# Patient Record
Sex: Female | Born: 1960 | Hispanic: No | Marital: Married | Smoking: Never smoker
Health system: Southern US, Community
[De-identification: ages and names within clinical notes are randomized; demographics above are authoritative.]

## PROBLEM LIST (undated history)

## (undated) DIAGNOSIS — R112 Nausea with vomiting, unspecified: Secondary | ICD-10-CM

## (undated) HISTORY — DX: Nausea with vomiting, unspecified: R11.2

## (undated) HISTORY — PX: BREAST CYST ASPIRATION: SHX578

---
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
## 1990-01-19 HISTORY — PX: TUBAL LIGATION: SHX77

## 1999-01-20 HISTORY — PX: NASAL SEPTUM SURGERY: SHX37

## 2013-06-20 DIAGNOSIS — M1711 Unilateral primary osteoarthritis, right knee: Secondary | ICD-10-CM | POA: Insufficient documentation

## 2018-08-30 DIAGNOSIS — N819 Female genital prolapse, unspecified: Secondary | ICD-10-CM | POA: Insufficient documentation

## 2019-01-20 HISTORY — PX: COLONOSCOPY: SHX174

## 2021-04-14 ENCOUNTER — Other Ambulatory Visit: Payer: Self-pay | Admitting: Family Medicine

## 2021-04-14 DIAGNOSIS — Z1231 Encounter for screening mammogram for malignant neoplasm of breast: Secondary | ICD-10-CM

## 2021-04-22 ENCOUNTER — Ambulatory Visit
Admission: RE | Admit: 2021-04-22 | Discharge: 2021-04-22 | Disposition: A | Discharge status: Home / Self Care | Payer: PRIVATE HEALTH INSURANCE | Source: Ambulatory Visit | Attending: Family Medicine | Admitting: Family Medicine

## 2021-04-22 DIAGNOSIS — Z1231 Encounter for screening mammogram for malignant neoplasm of breast: Secondary | ICD-10-CM

## 2021-05-22 ENCOUNTER — Ambulatory Visit
Admission: EM | Admit: 2021-05-22 | Discharge: 2021-05-22 | Disposition: A | Discharge status: Home / Self Care | Payer: PRIVATE HEALTH INSURANCE | Attending: Urgent Care | Admitting: Urgent Care

## 2021-05-22 DIAGNOSIS — H109 Unspecified conjunctivitis: Secondary | ICD-10-CM | POA: Diagnosis not present

## 2021-05-22 MED ORDER — TOBRAMYCIN 0.3 % OP SOLN: 1.0000 [drp] | OPHTHALMIC | 0 refills | Status: DC

## 2021-05-22 NOTE — ED Provider Notes (Signed)
?  [facility] ? ? ?MRN: [id number] DOB: 1960/11/12 ? ?Subjective:  ? ?Kristin Bridges is a 61 y.o. female presenting for 1 day history of irritated, stinging right thigh with associated redness and clear drainage.  Had matting of her eyelashes.  Patient works as a Lawyer.  No tunnel vision, visual halos, photophobia, eyelid pain or swelling, eye trauma.  No contact lens use.  Patient regularly gets her eye pressures checked with her general eye exams.  No history of glaucoma. ? ?No current facility-administered medications for this encounter. ?No current outpatient medications on file.  ? ?Not on File ? ?History reviewed. No pertinent past medical history.  ? ?Past Surgical History:  ?Procedure Laterality Date  ? BREAST CYST ASPIRATION    ? ? ?Family History  ?Problem Relation Age of Onset  ? Breast cancer Maternal Grandmother   ? ? ?Social History  ? ?Tobacco Use  ? Smoking status: Never  ? Smokeless tobacco: Never  ? ? ?ROS ? ? ?Objective:  ? ?Vitals: ?BP (!) 150/85 (BP Location: Left Arm)   Pulse 66   Temp 98 ?F (36.7 ?C) (Oral)   Resp 18   SpO2 98%  ? ?Physical Exam ?Constitutional:   ?   General: She is not in acute distress. ?   Appearance: Normal appearance. She is well-developed. She is not ill-appearing, toxic-appearing or diaphoretic.  ?HENT:  ?   Head: Normocephalic and atraumatic.  ?   Nose: Nose normal.  ?   Mouth/Throat:  ?   Mouth: Mucous membranes are moist.  ?Eyes:  ?   General: Lids are everted, no foreign bodies appreciated. Vision grossly intact. No scleral icterus.    ?   Right eye: Discharge (clear) present. No foreign body or hordeolum.     ?   Left eye: No foreign body, discharge or hordeolum.  ?   Extraocular Movements: Extraocular movements intact.  ?   Conjunctiva/sclera:  ?   Right eye: Right conjunctiva is injected. No chemosis, exudate or hemorrhage. ?   Left eye: Left conjunctiva is not injected. No chemosis, exudate or hemorrhage. ?Cardiovascular:  ?   Rate  and Rhythm: Normal rate.  ?Pulmonary:  ?   Effort: Pulmonary effort is normal.  ?Skin: ?   General: Skin is warm and dry.  ?Neurological:  ?   General: No focal deficit present.  ?   Mental Status: She is alert and oriented to person, place, and time.  ?Psychiatric:     ?   Mood and Affect: Mood normal.     ?   Behavior: Behavior normal.  ? ? ?Assessment and Plan :  ? ?PDMP not reviewed this encounter. ? ?1. Bacterial conjunctivitis of right eye   ? ?Will cover for bacterial conjunctivitis of the right eye with tobramycin eyedrops.  Low suspicion for an acute ophthalmologic emergency. Counseled patient on potential for adverse effects with medications prescribed/recommended today, ER and return-to-clinic precautions discussed, patient verbalized understanding. ? ?  ?Wallis Bamberg, PA-C ?05/22/21 1017 ? ?

## 2021-05-22 NOTE — ED Triage Notes (Signed)
Pt c/o right conjunctivitis onset this morning. States when she woke up her eye was matted with drainage.  ?

## 2022-03-05 ENCOUNTER — Ambulatory Visit (INDEPENDENT_AMBULATORY_CARE_PROVIDER_SITE_OTHER): Payer: PRIVATE HEALTH INSURANCE | Admitting: Family Medicine

## 2022-03-05 ENCOUNTER — Encounter: Payer: Self-pay | Admitting: Family Medicine

## 2022-03-05 VITALS — BP 128/85 | HR 73 | Temp 98.0°F | Resp 16 | Ht 68.0 in | Wt 203.0 lb

## 2022-03-05 DIAGNOSIS — Z114 Encounter for screening for human immunodeficiency virus [HIV]: Secondary | ICD-10-CM

## 2022-03-05 DIAGNOSIS — Z0000 Encounter for general adult medical examination without abnormal findings: Secondary | ICD-10-CM

## 2022-03-05 DIAGNOSIS — Z130 Encounter for screening for diseases of the blood and blood-forming organs and certain disorders involving the immune mechanism: Secondary | ICD-10-CM | POA: Diagnosis not present

## 2022-03-05 DIAGNOSIS — Z1231 Encounter for screening mammogram for malignant neoplasm of breast: Secondary | ICD-10-CM

## 2022-03-05 DIAGNOSIS — Z1329 Encounter for screening for other suspected endocrine disorder: Secondary | ICD-10-CM | POA: Diagnosis not present

## 2022-03-05 DIAGNOSIS — Z13220 Encounter for screening for lipoid disorders: Secondary | ICD-10-CM | POA: Diagnosis not present

## 2022-03-05 DIAGNOSIS — Z13228 Encounter for screening for other metabolic disorders: Secondary | ICD-10-CM | POA: Diagnosis not present

## 2022-03-05 DIAGNOSIS — Z1159 Encounter for screening for other viral diseases: Secondary | ICD-10-CM

## 2022-03-05 DIAGNOSIS — Z1211 Encounter for screening for malignant neoplasm of colon: Secondary | ICD-10-CM

## 2022-03-05 NOTE — Progress Notes (Signed)
New Patient Office Visit  Subjective    Patient ID: Kristin Bridges, female    DOB: October 07, 1960  Age: 62 y.o. MRN: [id number]  CC:  Chief Complaint  Patient presents with   Establish Care    HPI Kristin Bridges presents to establish care and for routine annual exam. Patient denies acute complaints.    Outpatient Encounter Medications as of 03/05/2022  Medication Sig   Cholecalciferol 25 MCG (1000 UT) capsule    ZINC METHIONATE PO    calcium carbonate (OSCAL) 1500 (600 Ca) MG TABS tablet    Multiple Vitamin (MULTIVITAMIN) capsule Take by mouth.   tobramycin (TOBREX) 0.3 % ophthalmic solution Place 1 drop into the right eye every 4 (four) hours.   No facility-administered encounter medications on file as of 03/05/2022.    History reviewed. No pertinent past medical history.  Past Surgical History:  Procedure Laterality Date   BREAST CYST ASPIRATION      Family History  Problem Relation Age of Onset   Breast cancer Maternal Grandmother     Social History   Socioeconomic History   Marital status: Married    Spouse name: Not on file   Number of children: Not on file   Years of education: Not on file   Highest education level: Not on file  Occupational History   Not on file  Tobacco Use   Smoking status: Never   Smokeless tobacco: Never  Substance and Sexual Activity   Alcohol use: Not Currently   Drug use: Not Currently   Sexual activity: Yes  Other Topics Concern   Not on file  Social History Narrative   Not on file   Social Determinants of Health   Financial Resource Strain: Not on file  Food Insecurity: Not on file  Transportation Needs: Not on file  Physical Activity: Not on file  Stress: Not on file  Social Connections: Not on file  Intimate Partner Violence: Not on file    Review of Systems  All other systems reviewed and are negative.       Objective    BP 128/85   Pulse 73   Temp 98 F (36.7 C) (Oral)   Resp 16   Ht 5' 8"$  (1.727 m)    Wt 203 lb (92.1 kg)   SpO2 96%   BMI 30.87 kg/m   Physical Exam Vitals and nursing note reviewed.  Constitutional:      General: She is not in acute distress. HENT:     Head: Normocephalic and atraumatic.     Right Ear: Tympanic membrane, ear canal and external ear normal.     Left Ear: Tympanic membrane, ear canal and external ear normal.     Nose: Nose normal.     Mouth/Throat:     Mouth: Mucous membranes are moist.     Pharynx: Oropharynx is clear.  Eyes:     Conjunctiva/sclera: Conjunctivae normal.     Pupils: Pupils are equal, round, and reactive to light.  Neck:     Thyroid: No thyromegaly.  Cardiovascular:     Rate and Rhythm: Normal rate and regular rhythm.     Heart sounds: Normal heart sounds. No murmur heard. Pulmonary:     Effort: Pulmonary effort is normal. No respiratory distress.     Breath sounds: Normal breath sounds.  Abdominal:     General: There is no distension.     Palpations: Abdomen is soft. There is no mass.     Tenderness:  There is no abdominal tenderness.  Musculoskeletal:        General: Normal range of motion.     Cervical back: Normal range of motion and neck supple.  Skin:    General: Skin is warm and dry.  Neurological:     General: No focal deficit present.     Mental Status: She is alert and oriented to person, place, and time.  Psychiatric:        Mood and Affect: Mood normal.        Behavior: Behavior normal.         Assessment & Plan:   1. Annual physical exam  - CMP14+EGFR  2. Screening for deficiency anemia  - CBC with Differential  3. Screening for lipid disorders  - Lipid Panel  4. Screening for endocrine/metabolic/immunity disorders   5. Encounter for screening mammogram for malignant neoplasm of breast  - MM Digital Screening; Future  6. Screening for colon cancer  - Cologuard  7. Screening for HIV (human immunodeficiency virus)   8. Need for hepatitis C screening test     Return in about 1  year (around 03/06/2023) for physical.   Becky Sax, MD

## 2022-03-06 LAB — CBC WITH DIFFERENTIAL/PLATELET
Basophils Absolute: 0 10*3/uL (ref 0.0–0.2)
Basos: 1 %
EOS (ABSOLUTE): 0.1 10*3/uL (ref 0.0–0.4)
Eos: 4 %
Hematocrit: 40.4 % (ref 34.0–46.6)
Hemoglobin: 13.3 g/dL (ref 11.1–15.9)
Immature Grans (Abs): 0 10*3/uL (ref 0.0–0.1)
Immature Granulocytes: 0 %
Lymphocytes Absolute: 0.9 10*3/uL (ref 0.7–3.1)
Lymphs: 26 %
MCH: 27.5 pg (ref 26.6–33.0)
MCHC: 32.9 g/dL (ref 31.5–35.7)
MCV: 84 fL (ref 79–97)
Monocytes Absolute: 0.3 10*3/uL (ref 0.1–0.9)
Monocytes: 8 %
Neutrophils Absolute: 2.2 10*3/uL (ref 1.4–7.0)
Neutrophils: 61 %
Platelets: 195 10*3/uL (ref 150–450)
RBC: 4.84 x10E6/uL (ref 3.77–5.28)
RDW: 15.6 % — ABNORMAL HIGH (ref 11.7–15.4)
WBC: 3.6 10*3/uL (ref 3.4–10.8)

## 2022-03-06 LAB — LIPID PANEL
Chol/HDL Ratio: 3 ratio (ref 0.0–4.4)
Cholesterol, Total: 182 mg/dL (ref 100–199)
HDL: 61 mg/dL (ref >39)
LDL Chol Calc (NIH): 107 mg/dL — ABNORMAL HIGH (ref 0–99)
Triglycerides: 74 mg/dL (ref 0–149)
VLDL Cholesterol Cal: 14 mg/dL (ref 5–40)

## 2022-03-06 LAB — CMP14+EGFR
ALT: 17 IU/L (ref 0–32)
AST: 19 IU/L (ref 0–40)
Albumin/Globulin Ratio: 1.8 (ref 1.2–2.2)
Albumin: 4.2 g/dL (ref 3.9–4.9)
Alkaline Phosphatase: 94 IU/L (ref 44–121)
BUN/Creatinine Ratio: 22 (ref 12–28)
BUN: 15 mg/dL (ref 8–27)
Bilirubin Total: 0.5 mg/dL (ref 0.0–1.2)
CO2: 23 mmol/L (ref 20–29)
Calcium: 8.7 mg/dL (ref 8.7–10.3)
Chloride: 105 mmol/L (ref 96–106)
Creatinine, Ser: 0.68 mg/dL (ref 0.57–1.00)
Globulin, Total: 2.3 g/dL (ref 1.5–4.5)
Glucose: 85 mg/dL (ref 70–99)
Potassium: 4.1 mmol/L (ref 3.5–5.2)
Sodium: 143 mmol/L (ref 134–144)
Total Protein: 6.5 g/dL (ref 6.0–8.5)
eGFR: 98 mL/min/{1.73_m2} (ref >59)

## 2022-03-06 LAB — VITAMIN D 25 HYDROXY (VIT D DEFICIENCY, FRACTURES): Vit D, 25-Hydroxy: 26.1 ng/mL — ABNORMAL LOW (ref 30.0–100.0)

## 2022-03-10 ENCOUNTER — Other Ambulatory Visit: Payer: Self-pay | Admitting: Family Medicine

## 2022-03-10 MED ORDER — VITAMIN D (ERGOCALCIFEROL) 1.25 MG (50000 UNIT) PO CAPS: 50000.0000 [IU] | ORAL_CAPSULE | ORAL | 0 refills | Status: DC

## 2022-03-19 ENCOUNTER — Encounter: Payer: Self-pay | Admitting: Family Medicine

## 2022-04-30 ENCOUNTER — Ambulatory Visit: Payer: PRIVATE HEALTH INSURANCE

## 2022-05-05 ENCOUNTER — Ambulatory Visit: Payer: PRIVATE HEALTH INSURANCE

## 2022-05-06 ENCOUNTER — Ambulatory Visit
Admission: RE | Admit: 2022-05-06 | Discharge: 2022-05-06 | Disposition: A | Discharge status: Home / Self Care | Payer: PRIVATE HEALTH INSURANCE | Source: Ambulatory Visit | Attending: Family Medicine | Admitting: Family Medicine

## 2022-05-06 DIAGNOSIS — Z1231 Encounter for screening mammogram for malignant neoplasm of breast: Secondary | ICD-10-CM

## 2022-05-11 ENCOUNTER — Telehealth: Payer: Self-pay | Admitting: Family Medicine

## 2022-05-11 ENCOUNTER — Other Ambulatory Visit: Payer: Self-pay | Admitting: *Deleted

## 2022-05-11 DIAGNOSIS — Z1211 Encounter for screening for malignant neoplasm of colon: Secondary | ICD-10-CM

## 2022-05-11 NOTE — Telephone Encounter (Signed)
Route to Research officer, political party.

## 2022-05-11 NOTE — Telephone Encounter (Signed)
Copied from CRM [id number]. Topic: Referral - Status >> May 06, 2022 12:32 PM Macon Large wrote: Reason for CRM: Pt stated she had an appt on 03/05/22 and a cologuard was ordered but she decided that she would like to request a referral for a colonoscopy to [facility]. Cb# [phone]

## 2022-05-11 NOTE — Telephone Encounter (Signed)
Copied from CRM [id number]. Topic: Complaint - Billing/Coding >> May 06, 2022 12:38 PM Ja-Kwan M wrote: DOS: 03/05/22 Details of complaint: Pt stated she was under the impression that the appt was for an annual exam but she was billed and she would like to discuss. How would the patient like to see this issue resolved? Pt requests call back to discuss because when she scheduled the appt she scheduled it for an annual exam   Route to Research officer, political party.

## 2022-05-11 NOTE — Telephone Encounter (Signed)
Copied from CRM #[id number]. Topic: Complaint - Billing/Coding >> May 06, 2022 12:38 PM Ja-Kwan M wrote: DOS: 03/05/22 Details of complaint: Pt stated she was under the impression that the appt was for an annual exam but she was billed and she would like to discuss. How would the patient like to see this issue resolved? Pt requests call back to discuss because when she scheduled the appt she scheduled it for an annual exam   Route to Practice Administrator. 

## 2022-05-19 NOTE — Addendum Note (Signed)
Addended by: Guy Franco on: 05/19/2022 03:58 PM   Modules accepted: Orders

## 2022-05-19 NOTE — Telephone Encounter (Signed)
Pt called to check status of request for a colonoscopy at [facility] per message below from 05/06/2022.

## 2022-06-09 ENCOUNTER — Encounter: Payer: Self-pay | Admitting: Internal Medicine

## 2022-09-02 ENCOUNTER — Ambulatory Visit (AMBULATORY_SURGERY_CENTER): Payer: PRIVATE HEALTH INSURANCE

## 2022-09-02 VITALS — Ht 68.0 in | Wt 200.0 lb

## 2022-09-02 DIAGNOSIS — Z1211 Encounter for screening for malignant neoplasm of colon: Secondary | ICD-10-CM

## 2022-09-02 MED ORDER — NA SULFATE-K SULFATE-MG SULF 17.5-3.13-1.6 GM/177ML PO SOLN: 1.0000 | Freq: Once | ORAL | 0 refills | Status: AC

## 2022-09-02 NOTE — Progress Notes (Signed)
Pre visit completed via phone call; Patient verified name, DOB, and address;  No egg or soy allergy known to patient;  No issues known to pt with past sedation with any surgeries or procedures---other than PONV; Patient denies ever being told they had issues or difficulty with intubation---other than PONV;  No FH of Malignant Hyperthermia; Pt is not on diet pills; Pt is not on home 02;  Pt is not on blood thinners;  Pt denies issues with constipation  No A fib or A flutter;  Have any cardiac testing pending--NO Insurance verified during PV appt--- Generic Commercial -[employer]  Pt can ambulate without assistance;  Pt denies use of chewing tobacco Discussed diabetic/weight loss medication holds; Discussed NSAID holds; Checked BMI to be less than 50; Pt instructed to use Singlecare.com or GoodRx for a price reduction on prep  Patient's chart reviewed by Cathlyn Parsons CNRA prior to previsit and patient appropriate for the [facility].  Pre visit completed and red dot placed by patient's name on their procedure day (on provider's schedule).    Instructions printed and mailed to the patient per her request, along with being sent to her MyChart account;

## 2022-09-04 ENCOUNTER — Encounter: Payer: Self-pay | Admitting: Internal Medicine

## 2022-09-09 ENCOUNTER — Telehealth: Payer: Self-pay | Admitting: Internal Medicine

## 2022-09-09 NOTE — Telephone Encounter (Signed)
Inbound call from patient wishing to discuss coding of 9/6 colonoscopy. Stated she received paperwork stating that it was not coded as preventative. Advised the letter we have it is coded as preventative. Patient requesting a call back to clarify. Please advise, thank you.

## 2022-09-18 ENCOUNTER — Telehealth: Payer: Self-pay | Admitting: Internal Medicine

## 2022-09-18 NOTE — Telephone Encounter (Signed)
Inbound call from patient stating she is scheduled for a colonoscopy on  9/6 at 4:00 and is requesting a call to discuss prep times. Please advise.

## 2022-09-18 NOTE — Telephone Encounter (Signed)
RN contacted patient to provide updated instructions regarding colonoscopy prep for her appointment which was changed from Wednesday, 09/23/22 to Friday, 09/25/22. All questions were answered. Patient stated understanding.

## 2022-09-23 ENCOUNTER — Encounter: Payer: PRIVATE HEALTH INSURANCE | Admitting: Internal Medicine

## 2022-09-25 ENCOUNTER — Ambulatory Visit (AMBULATORY_SURGERY_CENTER): Payer: PRIVATE HEALTH INSURANCE | Admitting: Internal Medicine

## 2022-09-25 ENCOUNTER — Encounter: Payer: Self-pay | Admitting: Internal Medicine

## 2022-09-25 VITALS — BP 127/69 | HR 64 | Temp 97.5°F | Resp 14 | Ht 68.0 in | Wt 200.0 lb

## 2022-09-25 DIAGNOSIS — D122 Benign neoplasm of ascending colon: Secondary | ICD-10-CM

## 2022-09-25 DIAGNOSIS — D123 Benign neoplasm of transverse colon: Secondary | ICD-10-CM | POA: Diagnosis not present

## 2022-09-25 DIAGNOSIS — Z1211 Encounter for screening for malignant neoplasm of colon: Secondary | ICD-10-CM

## 2022-09-25 MED ORDER — SODIUM CHLORIDE 0.9 % IV SOLN: 500.0000 mL | Freq: Once | INTRAVENOUS | Status: AC

## 2022-09-25 NOTE — Progress Notes (Signed)
Pt's states no medical or surgical changes since previsit or office visit. 

## 2022-09-25 NOTE — Patient Instructions (Signed)
YOU HAD AN ENDOSCOPIC PROCEDURE TODAY AT THE [facility]:   Refer to the procedure report that was given to you for any specific questions about what was found during the examination.  If the procedure report does not answer your questions, please call your gastroenterologist to clarify.  If you requested that your care partner not be given the details of your procedure findings, then the procedure report has been included in a sealed envelope for you to review at your convenience later.  YOU SHOULD EXPECT: Some feelings of bloating in the abdomen. Passage of more gas than usual.  Walking can help get rid of the air that was put into your GI tract during the procedure and reduce the bloating. If you had a lower endoscopy (such as a colonoscopy or flexible sigmoidoscopy) you may notice spotting of blood in your stool or on the toilet paper. If you underwent a bowel prep for your procedure, you may not have a normal bowel movement for a few days.  Please Note:  You might notice some irritation and congestion in your nose or some drainage.  This is from the oxygen used during your procedure.  There is no need for concern and it should clear up in a day or so.  SYMPTOMS TO REPORT IMMEDIATELY:  Following lower endoscopy (colonoscopy or flexible sigmoidoscopy):  Excessive amounts of blood in the stool  Significant tenderness or worsening of abdominal pains  Swelling of the abdomen that is new, acute  Fever of 100F or higher   For urgent or emergent issues, a gastroenterologist can be reached at any hour by calling [phone]. Do not use MyChart messaging for urgent concerns.    DIET:  We do recommend a small meal at first, but then you may proceed to your regular diet.  Drink plenty of fluids but you should avoid alcoholic beverages for 24 hours.  MEDICATIONS: Continue present medications.  Please see handouts given to you by your recovery nurse: Polyps, Hemorrhoids.  FOLLOW UP:  Await pathology results.  Thank you for allowing us to provide for your healthcare needs today.  ACTIVITY:  You should plan to take it easy for the rest of today and you should NOT DRIVE or use heavy machinery until tomorrow (because of the sedation medicines used during the test).    FOLLOW UP: Our staff will call the number listed on your records the next business day following your procedure.  We will call around 7:15- 8:00 am to check on you and address any questions or concerns that you may have regarding the information given to you following your procedure. If we do not reach you, we will leave a message.     If any biopsies were taken you will be contacted by phone or by letter within the next 1-3 weeks.  Please call us at [phone] if you have not heard about the biopsies in 3 weeks.    SIGNATURES/CONFIDENTIALITY: You and/or your care partner have signed paperwork which will be entered into your electronic medical record.  These signatures attest to the fact that that the information above on your After Visit Summary has been reviewed and is understood.  Full responsibility of the confidentiality of this discharge information lies with you and/or your care-partner. 

## 2022-09-25 NOTE — Progress Notes (Signed)
Uneventful anesthetic. Report to pacu rn. Vss. Care resumed by rn. 

## 2022-09-25 NOTE — Progress Notes (Signed)
GASTROENTEROLOGY PROCEDURE H&P NOTE   Primary Care Physician: Georganna Skeans, MD    Reason for Procedure:   Colon cancer screening  Plan:    Colonoscopy  Patient is appropriate for endoscopic procedure(s) in the ambulatory ([facility]) setting.  The nature of the procedure, as well as the risks, benefits, and alternatives were carefully and thoroughly reviewed with the patient. Ample time for discussion and questions allowed. The patient understood, was satisfied, and agreed to proceed.     HPI: Kristin Bridges is a 62 y.o. female who presents for colonoscopy for colon cancer screening. Denies blood in stools, changes in bowel habits, or unintentional weight loss. Denies family history of colon cancer. Last colonoscopy was at age 21 and was normal.  Past Medical History:  Diagnosis Date   PONV (postoperative nausea and vomiting)     Past Surgical History:  Procedure Laterality Date   BREAST CYST ASPIRATION     COLONOSCOPY  2021   [facility]   INGUINAL HERNIA REPAIR Left 1961   NASAL SEPTUM SURGERY  2001   TUBAL LIGATION  1992    Prior to Admission medications   Medication Sig Start Date End Date Taking? Authorizing Provider  Cholecalciferol 25 MCG (1000 UT) capsule  04/18/18   [provider]    Current Outpatient Medications  Medication Sig Dispense Refill   Cholecalciferol 25 MCG (1000 UT) capsule  (Patient not taking: Reported on 09/25/2022)     Current Facility-Administered Medications  Medication Dose Route Frequency Provider Last Rate Last Admin   0.9 %  sodium chloride infusion  500 mL Intravenous Once Imogene Burn, MD        Allergies as of 09/25/2022   (No Known Allergies)    Family History  Problem Relation Age of Onset   Breast cancer Maternal Grandmother    Colon cancer Neg Hx    Colon polyps Neg Hx     Social History   Socioeconomic History   Marital status: Married    Spouse name: Not on file   Number of children: Not on  file   Years of education: Not on file   Highest education level: Not on file  Occupational History   Not on file  Tobacco Use   Smoking status: Never   Smokeless tobacco: Never  Vaping Use   Vaping status: Never Used  Substance and Sexual Activity   Alcohol use: Not Currently   Drug use: Not Currently   Sexual activity: Yes  Other Topics Concern   Not on file  Social History Narrative   Not on file   Social Determinants of Health   Financial Resource Strain: Low Risk  (10/02/2020)   Received from [employer] [employer]   Overall Physicist, medical Strain (CARDIA)    Difficulty of Paying Living Expenses: Not hard at all  Food Insecurity: No Food Insecurity (10/02/2020)   Received from [employer], [employer] Vital Sign    Worried About Programme researcher, broadcasting/film/video in the Last Year: Never true    Ran Out of Food in the Last Year: Never true  Transportation Needs: No Transportation Needs (10/02/2020)   Received from [employer], [facility]   [employer] - Administrator, Civil Service (Medical): No    Lack of Transportation (Non-Medical): No  Physical Activity: Sufficiently Active (10/02/2020)   Received from [employer] [employer]   Exercise Vital Sign    Days of Exercise per Week: 7  days    Minutes of Exercise per Session: 30 min  Stress: No Stress Concern Present (10/02/2020)   Received from [employer] [employer]   [employer] of Occupational Health - Occupational Stress Questionnaire    Feeling of Stress : Not at all  Social Connections: Socially Integrated (10/02/2020)   Received from [employer], [employer]   Social Connection and Isolation Panel [NHANES]    Frequency of Communication with Friends and Family: More than three times a week    Frequency of Social Gatherings with Friends and Family: Twice a week    Attends Religious Services: More than 4  times per year    Active Member of [employer] or Organizations: No    Attends Engineer, structural: More than 4 times per year    Marital Status: Married  Catering manager Violence: Not At Risk (10/02/2020)   Received from [employer], [employer], Afraid, Rape, and Kick questionnaire    Fear of Current or Ex-Partner: No    Emotionally Abused: No    Physically Abused: No    Sexually Abused: No    Physical Exam: Vital signs in last 24 hours: BP 137/80   Pulse 82   Temp (!) 97.5 F (36.4 C) (Temporal)   Ht 5\' 8"  (1.727 m)   Wt 200 lb (90.7 kg)   SpO2 100%   BMI 30.41 kg/m  GEN: NAD EYE: Sclerae anicteric ENT: MMM CV: Non-tachycardic Pulm: No increased work of breathing GI: Soft, NT/ND NEURO:  Alert & Oriented   Eulah Pont, MD [facility]  09/25/2022 3:40 PM

## 2022-09-25 NOTE — Op Note (Signed)
[facility] Patient Name: Kristin Bridges Procedure Date: 09/25/2022 3:39 PM MRN: [id number] Endoscopist: Madelyn Brunner [place] , , [id number] Age: 62 Referring MD:  Date of Birth: February 03, 1960 Gender: Female Account #: [id number] Procedure:                Colonoscopy Indications:              Screening for colorectal malignant neoplasm Medicines:                Monitored Anesthesia Care Procedure:                Pre-Anesthesia Assessment:                           - Prior to the procedure, a History and Physical                            was performed, and patient medications and                            allergies were reviewed. The patient's tolerance of                            previous anesthesia was also reviewed. The risks                            and benefits of the procedure and the sedation                            options and risks were discussed with the patient.                            All questions were answered, and informed consent                            was obtained. Prior Anticoagulants: The patient has                            taken no anticoagulant or antiplatelet agents. ASA                            Grade Assessment: II - A patient with mild systemic                            disease. After reviewing the risks and benefits,                            the patient was deemed in satisfactory condition to                            undergo the procedure.                           After obtaining informed consent, the colonoscope  was passed under direct vision. Throughout the                            procedure, the patient's blood pressure, pulse, and                            oxygen saturations were monitored continuously. The                            Olympus CF-HQ190L [id number]) Colonoscope was                            introduced through the anus and advanced to the the                            terminal  ileum. The colonoscopy was performed                            without difficulty. The patient tolerated the                            procedure well. The quality of the bowel                            preparation was good. The terminal ileum, ileocecal                            valve, appendiceal orifice, and rectum were                            photographed. Scope In: 3:51:54 PM Scope Out: 4:10:11 PM Scope Withdrawal Time: 0 hours 9 minutes 40 seconds  Total Procedure Duration: 0 hours 18 minutes 17 seconds  Findings:                 The terminal ileum appeared normal.                           Two sessile polyps were found in the transverse                            colon and ascending colon. The polyps were 3 to 4                            mm in size. These polyps were removed with a cold                            snare. Resection and retrieval were complete.                           Non-bleeding internal hemorrhoids were found during                            retroflexion. Complications:  No immediate complications. Estimated Blood Loss:     Estimated blood loss was minimal. Impression:               - The examined portion of the ileum was normal.                           - Two 3 to 4 mm polyps in the transverse colon and                            in the ascending colon, removed with a cold snare.                            Resected and retrieved.                           - Non-bleeding internal hemorrhoids. Recommendation:           - Discharge patient to home (with escort).                           - Await pathology results.                           - The findings and recommendations were discussed                            with the patient. Dr Particia Lather "[name]" [name],  09/25/2022 4:13:48 PM

## 2022-09-28 ENCOUNTER — Telehealth: Payer: Self-pay

## 2022-09-28 NOTE — Telephone Encounter (Signed)
  Follow up Call-     09/25/2022    3:18 PM  Call back number  Post procedure Call Back phone  # [phone]  Permission to leave phone message Yes     Patient questions:  Do you have a fever, pain , or abdominal swelling? No. Pain Score  0 *  Have you tolerated food without any problems? Yes.    Have you been able to return to your normal activities? Yes.    Do you have any questions about your discharge instructions: Diet   No. Medications  No. Follow up visit  No.  Do you have questions or concerns about your Care? No.  Actions: * If pain score is 4 or above: No action needed, pain <4.

## 2022-09-30 LAB — SURGICAL PATHOLOGY

## 2022-10-05 ENCOUNTER — Encounter: Payer: Self-pay | Admitting: Internal Medicine

## 2023-03-09 ENCOUNTER — Encounter: Payer: PRIVATE HEALTH INSURANCE | Admitting: Family Medicine

## 2023-04-08 ENCOUNTER — Other Ambulatory Visit: Payer: Self-pay | Admitting: Family Medicine

## 2023-04-08 DIAGNOSIS — Z1231 Encounter for screening mammogram for malignant neoplasm of breast: Secondary | ICD-10-CM

## 2023-05-10 ENCOUNTER — Ambulatory Visit: Payer: PRIVATE HEALTH INSURANCE

## 2023-05-12 ENCOUNTER — Ambulatory Visit
Admission: RE | Admit: 2023-05-12 | Discharge: 2023-05-12 | Disposition: A | Discharge status: Home / Self Care | Payer: PRIVATE HEALTH INSURANCE | Source: Ambulatory Visit | Attending: Family Medicine | Admitting: Family Medicine

## 2023-05-12 DIAGNOSIS — Z1231 Encounter for screening mammogram for malignant neoplasm of breast: Secondary | ICD-10-CM

## 2024-03-19 IMAGING — MG MM DIGITAL SCREENING BILAT W/ TOMO AND CAD
8 series · 8 of 24 positions shown · non-contrast
Comparison: Previous exam(s).

CLINICAL DATA: Screening.

EXAM:
DIGITAL SCREENING BILATERAL MAMMOGRAM WITH TOMOSYNTHESIS AND CAD
TECHNIQUE: Bilateral screening digital craniocaudal and mediolateral oblique
mammograms were obtained. Bilateral screening digital breast
tomosynthesis was performed. The images were evaluated with
computer-aided detection.

[R CC synth-2D]
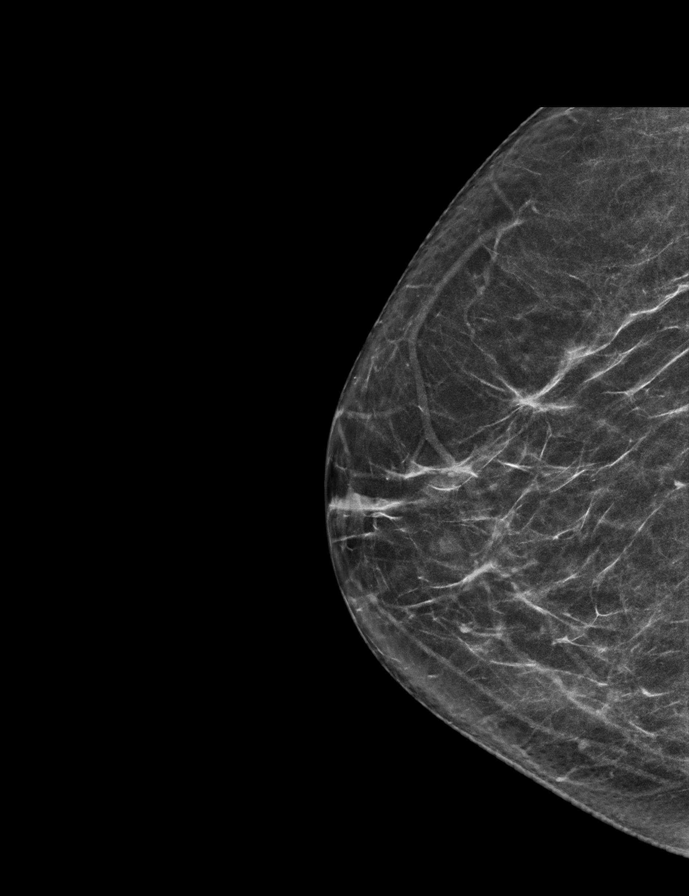

[L CC synth-2D]
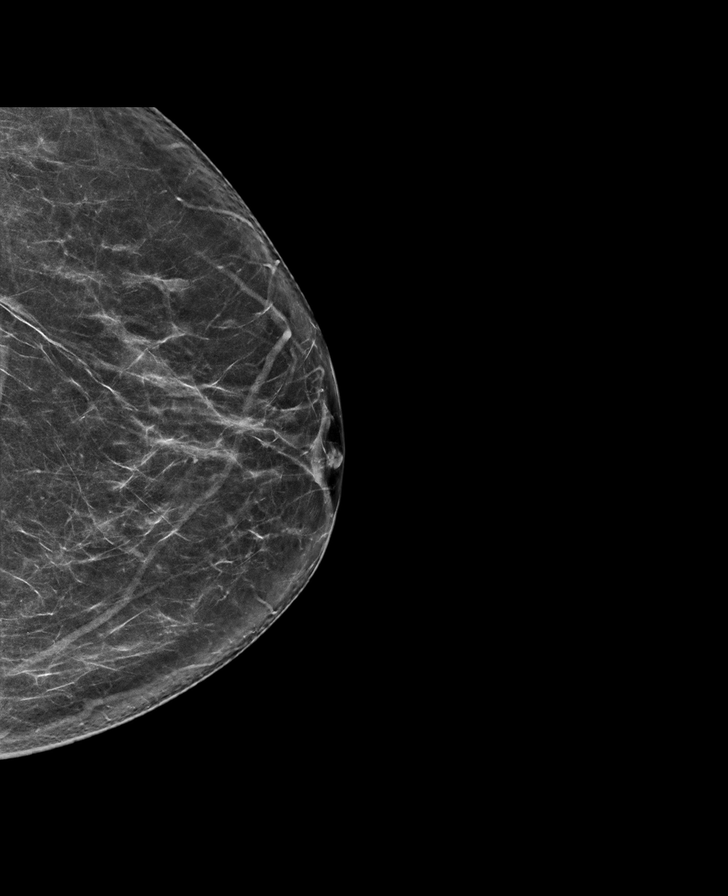

[L MLO synth-2D]
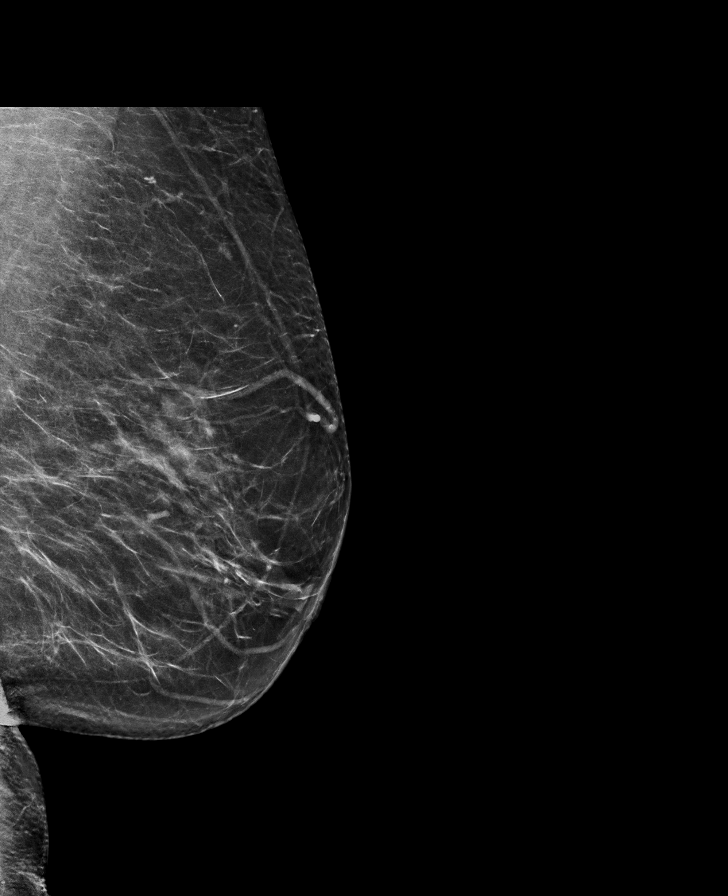

[R MLO synth-2D]
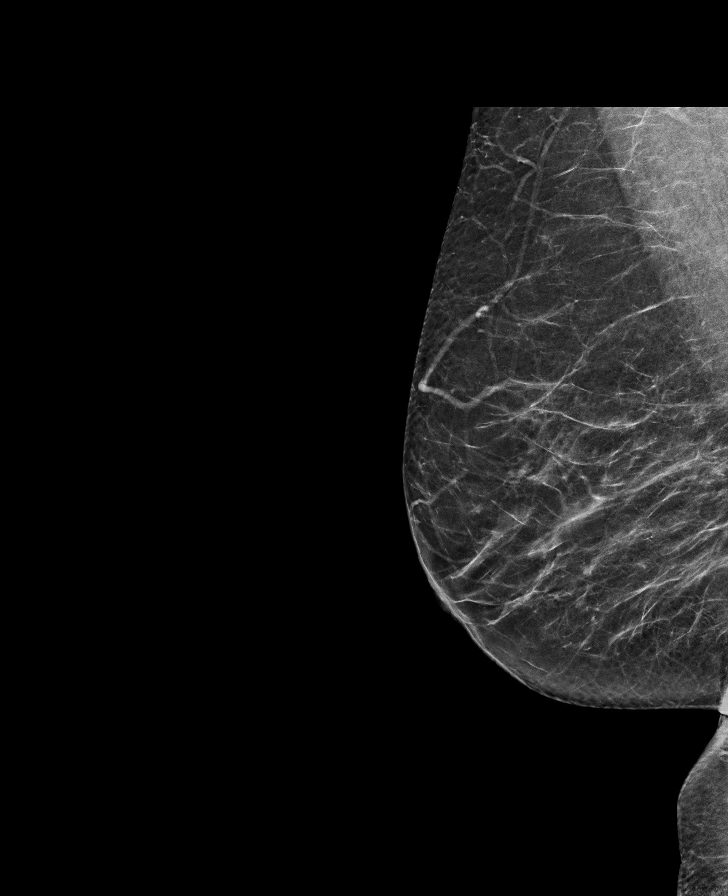

[R CC tomo · tomo slice 32/63.0]
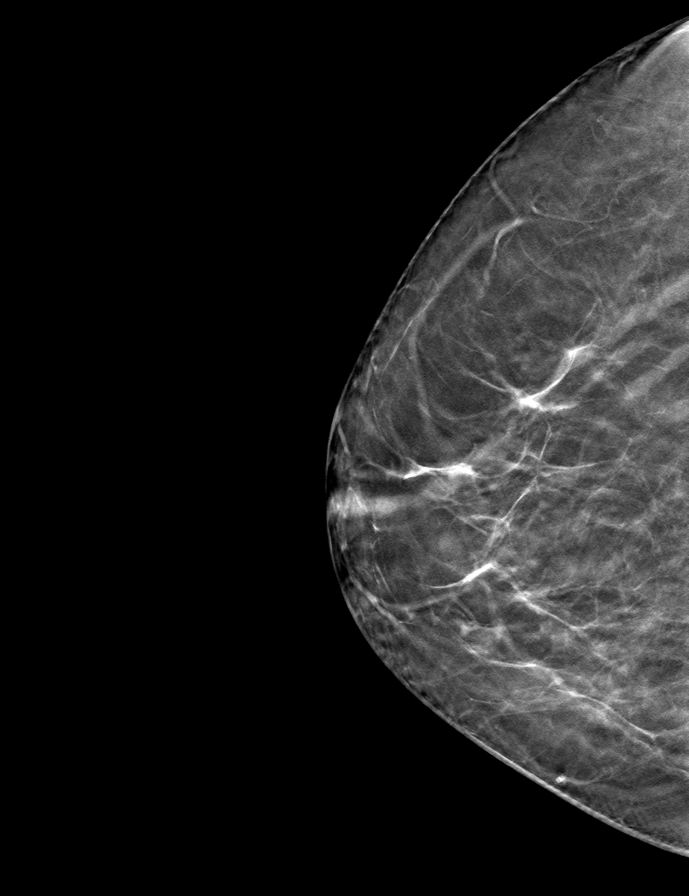

[R MLO tomo · tomo slice 33/66.0]
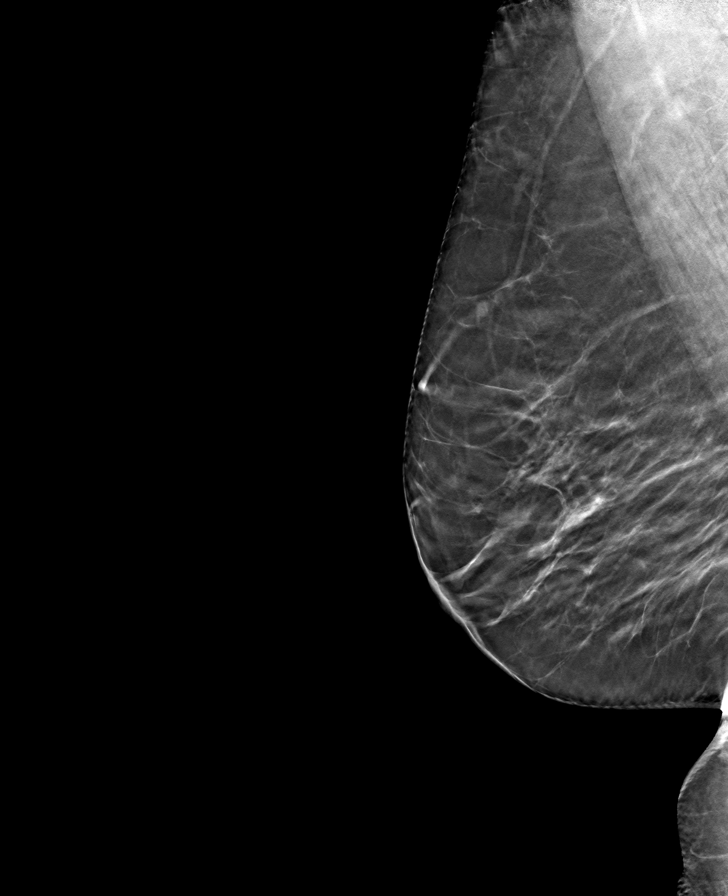

[L CC tomo · tomo slice 36/71.0]
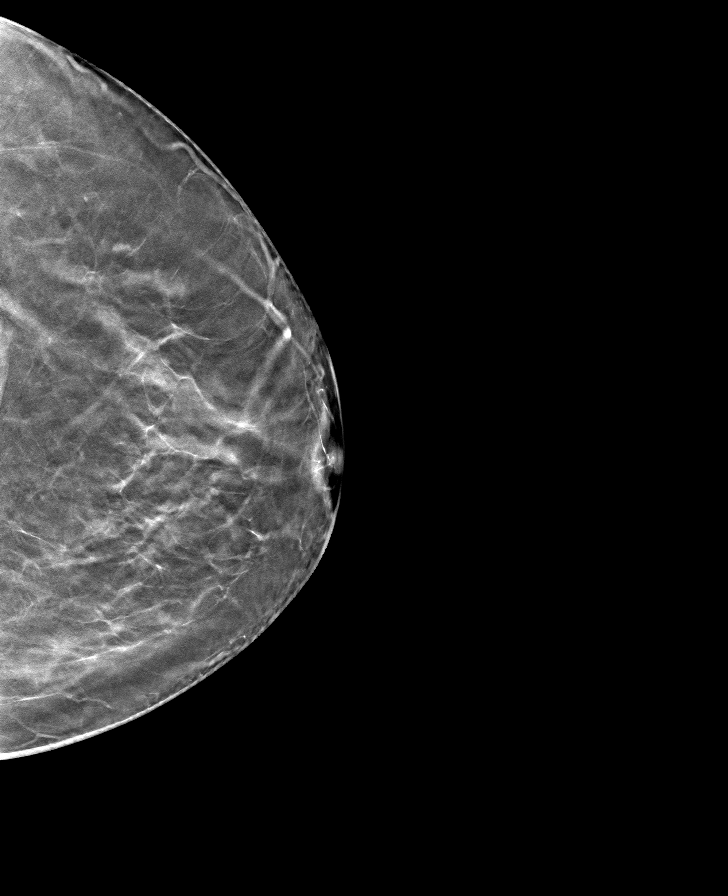

[L MLO tomo · tomo slice 37/73.0]
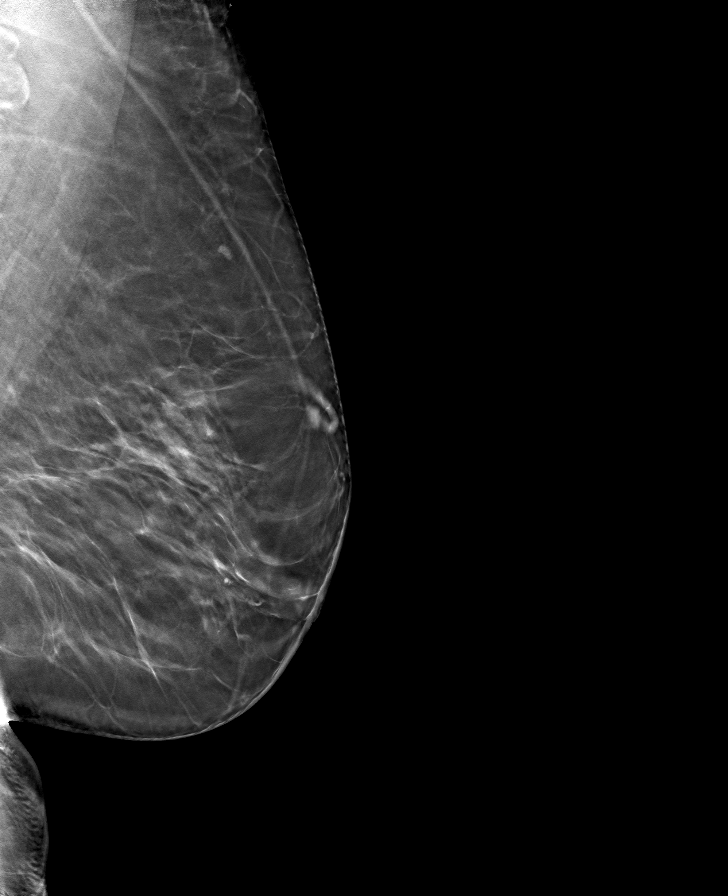

[8 of 24 positions shown; findings below may reference images not displayed]

ACR Breast Density Category b: There are scattered areas of
fibroglandular density.
FINDINGS: There are no findings suspicious for malignancy.
IMPRESSION: No mammographic evidence of malignancy. A result letter of this
screening mammogram will be mailed directly to the patient.

RECOMMENDATION:
Screening mammogram in one year. (Code:51-O-LD2)

BI-RADS CATEGORY  1: Negative.
